# Patient Record
Sex: Female | Born: 1955 | Race: White | Hispanic: No | Marital: Married | Smoking: Never smoker
Health system: Southern US, Community
[De-identification: ages and names within clinical notes are randomized; demographics above are authoritative.]

## PROBLEM LIST (undated history)

## (undated) HISTORY — PX: REPLACEMENT TOTAL KNEE BILATERAL: SUR1225

## (undated) HISTORY — PX: THYROIDECTOMY, PARTIAL: SHX18

## (undated) HISTORY — PX: PARTIAL HYSTERECTOMY: SHX80

---

## 2020-08-23 ENCOUNTER — Ambulatory Visit: Payer: 59 | Admitting: Family

## 2020-08-23 ENCOUNTER — Encounter: Payer: Self-pay | Admitting: Family

## 2020-08-23 ENCOUNTER — Other Ambulatory Visit: Payer: Self-pay

## 2020-08-23 VITALS — BP 130/78 | HR 99 | Temp 97.9°F | Ht 64.0 in | Wt 171.0 lb

## 2020-08-23 DIAGNOSIS — Z8589 Personal history of malignant neoplasm of other organs and systems: Secondary | ICD-10-CM

## 2020-08-23 DIAGNOSIS — L719 Rosacea, unspecified: Secondary | ICD-10-CM | POA: Insufficient documentation

## 2020-08-23 DIAGNOSIS — Z96653 Presence of artificial knee joint, bilateral: Secondary | ICD-10-CM

## 2020-08-23 DIAGNOSIS — Z1211 Encounter for screening for malignant neoplasm of colon: Secondary | ICD-10-CM

## 2020-08-23 DIAGNOSIS — C4492 Squamous cell carcinoma of skin, unspecified: Secondary | ICD-10-CM

## 2020-08-23 DIAGNOSIS — E890 Postprocedural hypothyroidism: Secondary | ICD-10-CM

## 2020-08-23 DIAGNOSIS — Z1231 Encounter for screening mammogram for malignant neoplasm of breast: Secondary | ICD-10-CM

## 2020-08-23 DIAGNOSIS — Z124 Encounter for screening for malignant neoplasm of cervix: Secondary | ICD-10-CM | POA: Diagnosis not present

## 2020-08-23 DIAGNOSIS — Z85828 Personal history of other malignant neoplasm of skin: Secondary | ICD-10-CM

## 2020-08-23 DIAGNOSIS — E079 Disorder of thyroid, unspecified: Secondary | ICD-10-CM

## 2020-08-23 DIAGNOSIS — C4491 Basal cell carcinoma of skin, unspecified: Secondary | ICD-10-CM

## 2020-08-23 DIAGNOSIS — Z13220 Encounter for screening for lipoid disorders: Secondary | ICD-10-CM

## 2020-08-23 MED ORDER — AMOXICILLIN 500 MG PO CAPS: ORAL_CAPSULE | ORAL | 0 refills | Status: AC

## 2020-08-23 NOTE — Patient Instructions (Signed)
You will need go to [place] for your labs; go to basement;

## 2020-08-23 NOTE — Progress Notes (Signed)
Patricia Terry is a 64 y.o. female with the following history as recorded in [employer]:  Patient Active Problem List   Diagnosis Date Noted  . Rosacea 08/23/2020  . H/O partial thyroidectomy 08/23/2020  . Basal cell carcinoma (BCC) 08/23/2020  . Squamous cell skin cancer 08/23/2020  . History of bilateral knee replacement 08/23/2020    Current Outpatient Medications  Medication Sig Dispense Refill  . doxycycline (VIBRAMYCIN) 50 MG capsule Take 50 mg by mouth 2 (two) times daily.    Marland Kitchen amoxicillin (AMOXIL) 500 MG capsule Take 4 capsules 1 hour prior to procedure 20 capsule 0   No current facility-administered medications for this visit.    Allergies: Cat hair extract  History reviewed. No pertinent past medical history.  Past Surgical History:  Procedure Laterality Date  . PARTIAL HYSTERECTOMY    . REPLACEMENT TOTAL KNEE BILATERAL    . THYROIDECTOMY, PARTIAL      Family History  Problem Relation Age of Onset  . Pancreatic cancer Mother   . Pulmonary fibrosis Father   . Colon cancer Maternal Grandmother     Social History   Tobacco Use  . Smoking status: Never Smoker  . Smokeless tobacco: Never Used  Substance Use Topics  . Alcohol use: Yes    Comment: social    Subjective:  Presents today as a new patient; recently moved to [place] and needs to get established with specialists; no specific acute concerns today other than needs Rx for Amoxicillin due to upcoming dental procedure;  Objective:  Vitals:   08/23/20 1009  BP: 130/78  Pulse: 99  Temp: 97.9 F (36.6 C)  TempSrc: Oral  SpO2: 97%  Weight: 171 lb (77.6 kg)  Height: 5\' 4"  (1.626 m)    General: Well developed, well nourished, in no acute distress  Skin : Warm and dry.  Head: Normocephalic and atraumatic  Eyes: Sclera and conjunctiva clear; pupils round and reactive to light; extraocular movements intact  Ears: External normal; canals clear; tympanic membranes normal  Oropharynx: Pink, supple. No  suspicious lesions  Neck: Supple without thyromegaly, adenopathy  Lungs: Respirations unlabored; clear to auscultation bilaterally without wheeze, rales, rhonchi  CVS exam: normal rate and regular rhythm.  Abdomen: Soft; nontender; nondistended; normoactive bowel sounds; no masses or hepatosplenomegaly  Musculoskeletal: No deformities; no active joint inflammation  Extremities: No edema, cyanosis, clubbing  Vessels: Symmetric bilaterally  Neurologic: Alert and oriented; speech intact; face symmetrical; moves all extremities well; CNII-XII intact without focal deficit   Assessment:  1. Rosacea   2. Screening mammogram for breast cancer   3. Cervical cancer screening   4. Encounter for screening colonoscopy   5. History of basal cell cancer   6. History of squamous cell carcinoma   7. Thyroid disease   8. H/O partial thyroidectomy   9. Basal cell carcinoma (BCC), unspecified site   10. Squamous cell skin cancer   11. History of bilateral knee replacement     Plan:  1. Refer to dermatology; 2. Refer to GYN; patient prefers to have mammogram done at GYN office- agree this appropriate; 4. Refer to GI- history of colon polyps/ FH of colon cancer; 5. & 6. Refer to dermatology; 7. Will check TSH; get baseline thyroid ultrasound; 11. Rx for Amoxicillin to use as needed prior to dental visits;   This visit occurred during the SARS-CoV-2 public health emergency.  Safety protocols were in place, including screening questions prior to the visit, additional usage of staff PPE, and extensive cleaning  of exam room while observing appropriate contact time as indicated for disinfecting solutions.   Time spent 30 minutes  No follow-ups on file.  Orders Placed This Encounter  Procedures  . US THYROID    Standing Status:   Future    Standing Expiration Date:   08/23/2021    Order Specific Question:   Reason for Exam (SYMPTOM  OR DIAGNOSIS REQUIRED)    Answer:   partial thyroidectomy    Order  Specific Question:   Preferred imaging location?    Answer:   [facility]  . Ambulatory referral to Gynecology    Referral Priority:   Routine    Referral Type:   Consultation    Referral Reason:   Specialty Services Required    Requested Specialty:   Gynecology    Number of Visits Requested:   1  . Ambulatory referral to Gastroenterology    Referral Priority:   Routine    Referral Type:   Consultation    Referral Reason:   Specialty Services Required    Number of Visits Requested:   1  . Ambulatory referral to Dermatology    Referral Priority:   Routine    Referral Type:   Consultation    Referral Reason:   Specialty Services Required    Requested Specialty:   Dermatology    Number of Visits Requested:   1    Requested Prescriptions   Signed Prescriptions Disp Refills  . amoxicillin (AMOXIL) 500 MG capsule 20 capsule 0    Sig: Take 4 capsules 1 hour prior to procedure

## 2020-09-13 ENCOUNTER — Ambulatory Visit
Admission: RE | Admit: 2020-09-13 | Discharge: 2020-09-13 | Disposition: A | Discharge status: Home / Self Care | Payer: 59 | Source: Ambulatory Visit | Attending: Family | Admitting: Family

## 2020-09-13 DIAGNOSIS — E079 Disorder of thyroid, unspecified: Secondary | ICD-10-CM

## 2020-09-14 ENCOUNTER — Other Ambulatory Visit: Payer: Self-pay | Admitting: Family

## 2020-09-14 DIAGNOSIS — E041 Nontoxic single thyroid nodule: Secondary | ICD-10-CM

## 2020-09-23 ENCOUNTER — Ambulatory Visit
Admission: RE | Admit: 2020-09-23 | Discharge: 2020-09-23 | Disposition: A | Discharge status: Home / Self Care | Payer: 59 | Source: Ambulatory Visit | Attending: Family | Admitting: Family

## 2020-09-23 ENCOUNTER — Other Ambulatory Visit (HOSPITAL_COMMUNITY)
Admission: RE | Admit: 2020-09-23 | Discharge: 2020-09-23 | Disposition: A | Discharge status: Home / Self Care | Payer: 59 | Source: Ambulatory Visit | Attending: Family | Admitting: Family

## 2020-09-23 DIAGNOSIS — E041 Nontoxic single thyroid nodule: Secondary | ICD-10-CM

## 2020-09-23 DIAGNOSIS — D34 Benign neoplasm of thyroid gland: Secondary | ICD-10-CM | POA: Diagnosis not present

## 2020-09-26 LAB — CYTOLOGY - NON PAP

## 2020-09-30 ENCOUNTER — Other Ambulatory Visit: Payer: Self-pay | Admitting: Family

## 2020-09-30 DIAGNOSIS — E049 Nontoxic goiter, unspecified: Secondary | ICD-10-CM

## 2020-10-18 ENCOUNTER — Other Ambulatory Visit: Payer: Self-pay | Admitting: Obstetrics and Gynecology

## 2020-10-18 DIAGNOSIS — R928 Other abnormal and inconclusive findings on diagnostic imaging of breast: Secondary | ICD-10-CM

## 2020-10-20 ENCOUNTER — Other Ambulatory Visit: Payer: Self-pay | Admitting: Surgery

## 2020-10-20 DIAGNOSIS — R1310 Dysphagia, unspecified: Secondary | ICD-10-CM

## 2020-10-25 ENCOUNTER — Other Ambulatory Visit: Payer: Self-pay | Admitting: Surgery

## 2020-10-25 ENCOUNTER — Ambulatory Visit
Admission: RE | Admit: 2020-10-25 | Discharge: 2020-10-25 | Disposition: A | Discharge status: Home / Self Care | Payer: 59 | Source: Ambulatory Visit | Attending: Surgery | Admitting: Surgery

## 2020-10-25 DIAGNOSIS — R1310 Dysphagia, unspecified: Secondary | ICD-10-CM

## 2020-10-31 ENCOUNTER — Other Ambulatory Visit: Payer: 59

## 2020-11-02 ENCOUNTER — Ambulatory Visit
Admission: RE | Admit: 2020-11-02 | Discharge: 2020-11-02 | Disposition: A | Discharge status: Home / Self Care | Payer: 59 | Source: Ambulatory Visit | Attending: Obstetrics and Gynecology | Admitting: Obstetrics and Gynecology

## 2020-11-02 ENCOUNTER — Ambulatory Visit: Payer: 59

## 2020-11-02 ENCOUNTER — Other Ambulatory Visit: Payer: Self-pay

## 2020-11-02 DIAGNOSIS — R928 Other abnormal and inconclusive findings on diagnostic imaging of breast: Secondary | ICD-10-CM

## 2021-09-11 ENCOUNTER — Other Ambulatory Visit: Payer: Self-pay | Admitting: Surgery

## 2021-09-11 DIAGNOSIS — E041 Nontoxic single thyroid nodule: Secondary | ICD-10-CM

## 2021-10-02 ENCOUNTER — Ambulatory Visit
Admission: RE | Admit: 2021-10-02 | Discharge: 2021-10-02 | Disposition: A | Discharge status: Home / Self Care | Payer: Medicare Other | Source: Ambulatory Visit | Attending: Surgery | Admitting: Surgery

## 2021-10-02 DIAGNOSIS — E041 Nontoxic single thyroid nodule: Secondary | ICD-10-CM

## 2022-07-20 ENCOUNTER — Telehealth: Payer: Self-pay

## 2022-07-20 NOTE — Telephone Encounter (Signed)
Nurse Assessment Nurse: Toy Cookey, RN, [facility] Date/Time Eilene Ghazi Time): 07/20/2022 8:10:06 AM Confirm and document reason for call. If symptomatic, describe symptoms. ---Caller states she tested positive for COVID this morning. Only Sx is congestion. Started on Sunday. Up-to-date Vaccinations. Does the patient have any new or worsening symptoms? ---Yes Will a triage be completed? ---Yes Related visit to physician within the last 2 weeks? ---No Does the PT have any chronic conditions? (i.e. diabetes, asthma, this includes High risk factors for pregnancy, etc.) ---No Is this a behavioral health or substance abuse call? ---No Guidelines Guideline Title Affirmed Question Affirmed Notes Nurse Date/Time (Moody Time) COVID-19 - Diagnosed or Suspected [1] HIGH RISK patient (e.g., weak immune system, age > 70 years, obesity with BMI 30 or higher, pregnant, chronic lung disease or other chronic medical condition) AND [2] COVID symptoms (e.g., cough, fever) (Exceptions: Already seen by PCP and no Toy Cookey, RN, [facility] 07/20/2022 8:16:56 AM PLEASE NOTE: All timestamps contained within this report are represented as [place] Standard Time. CONFIDENTIALTY NOTICE: This fax transmission is intended only for the addressee. It contains information that is legally privileged, confidential or otherwise protected from use or disclosure. If you are not the intended recipient, you are strictly prohibited from reviewing, disclosing, copying using or disseminating any of this information or taking any action in reliance on or regarding this information. If you have received this fax in error, please notify us immediately by telephone so that we can arrange for its return to [place]. Phone: [phone], Toll-Free: [phone], Fax: [phone] Page: 2 of 2 Call Id: [id number] Guidelines Guideline Title Affirmed Question Affirmed Notes Nurse Date/Time Eilene Ghazi Time) new or worsening symptoms.) Disp. Time  Eilene Ghazi Time) Disposition Final User 07/20/2022 8:21:21 AM Call PCP within 24 Hours Yes Toy Cookey RN, [facility] Final Disposition 07/20/2022 8:21:21 AM Call PCP within 24 Hours Yes Toy Cookey, RN, [facility] Disagree/Comply Comply Caller Understands Yes PreDisposition InappropriateToAsk Care Advice Given Per Guideline CALL PCP WITHIN 24 HOURS: * IF OFFICE WILL BE OPEN: Call the office when it opens tomorrow morning. CALL BACK IF: * You become worse Comments User: Dulcy Fanny, RN Date/Time Eilene Ghazi Time): 07/20/2022 8:22:55 AM requesting call back from office

## 2022-07-23 NOTE — Telephone Encounter (Signed)
I have called the pt to check up on them. They had Covid for a week and now they are on the mend. They are feeling better and they both thing that the Vaccines that they got on 06/20/22 has helped lessen the side effects of the medication. I stated understanding and wished them a happy holiday weekend.

## 2023-01-01 IMAGING — MG MM DIGITAL DIAGNOSTIC UNILAT*R* W/ TOMO W/ CAD
4 series · 4 of 12 positions shown · non-contrast
Comparison: Previous exam(s).

CLINICAL DATA: Patient returns after screening study for evaluation
of possible RIGHT breast asymmetry.

EXAM:
DIGITAL DIAGNOSTIC UNILATERAL RIGHT MAMMOGRAM WITH TOMOSYNTHESIS AND
CAD
TECHNIQUE: Right digital diagnostic mammography and breast tomosynthesis was
performed. The images were evaluated with computer-aided detection.

[R MLO synth-2D]
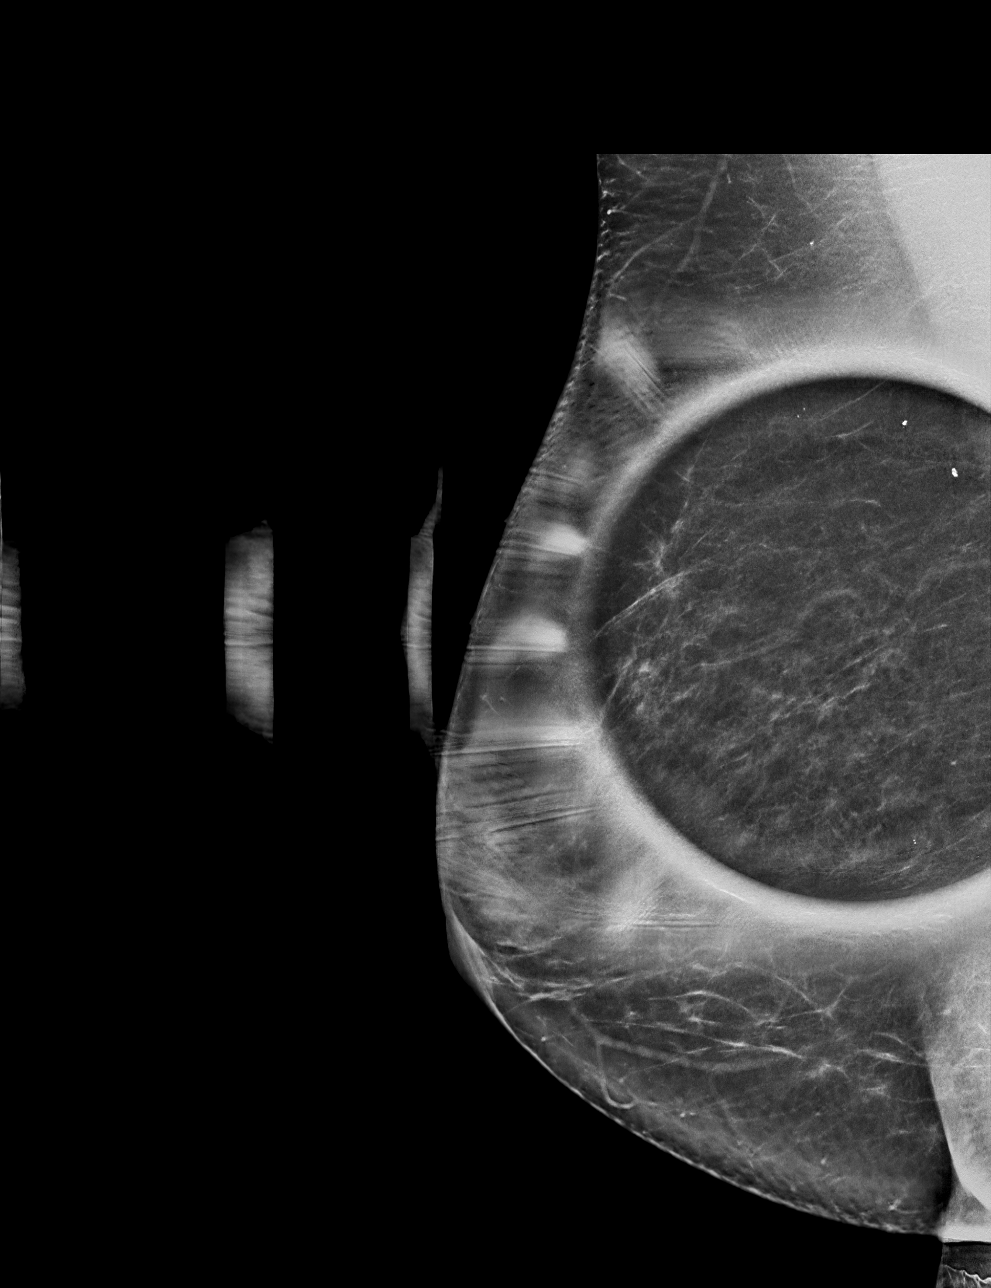

[R XCCL synth-2D]
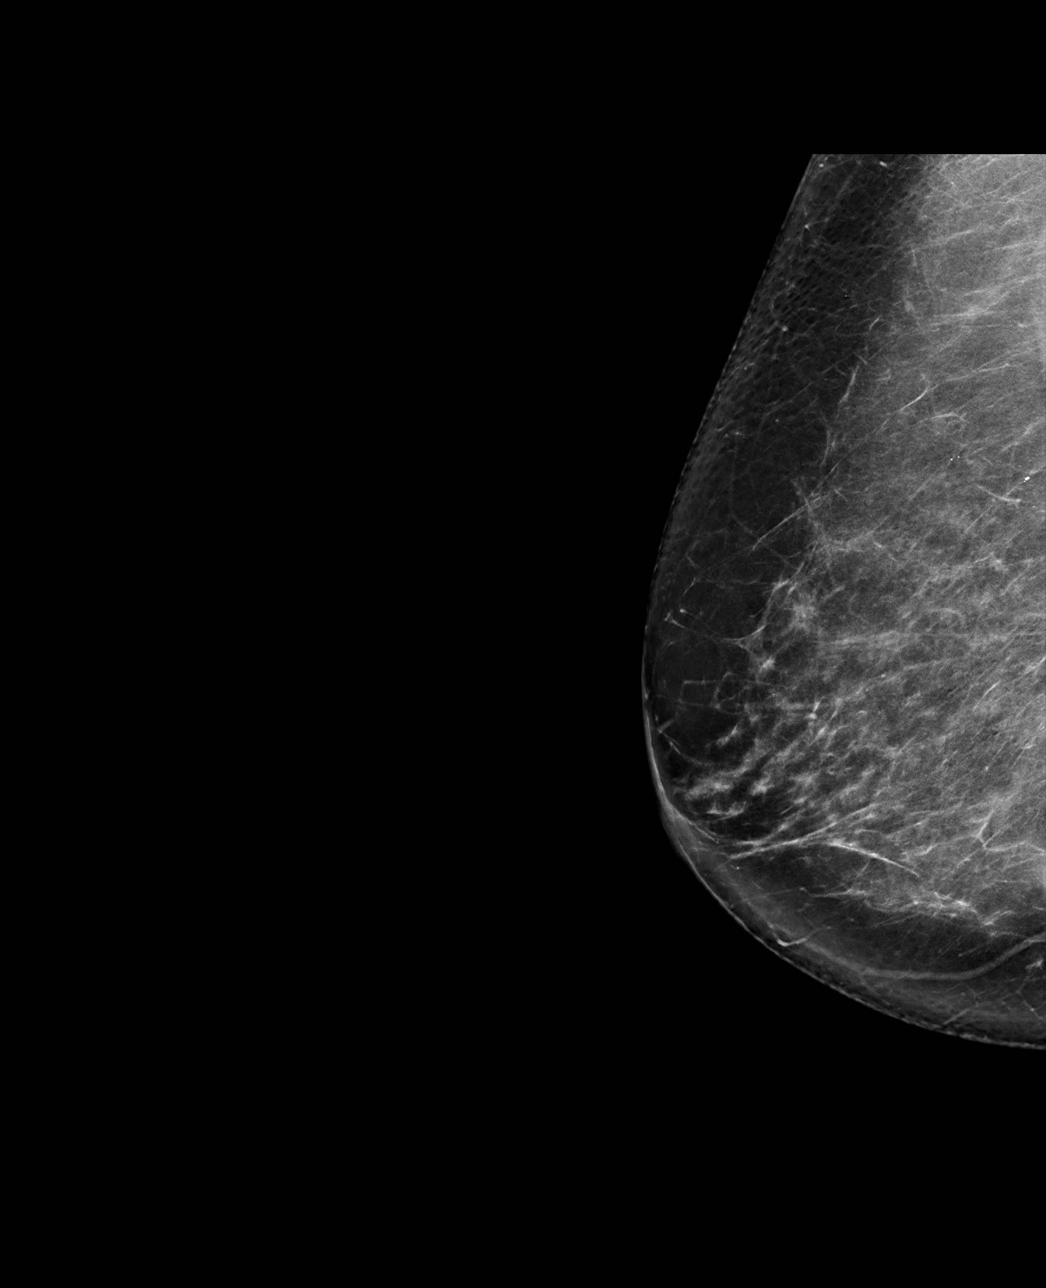

[R MLO tomo · tomo slice 41/81.0]
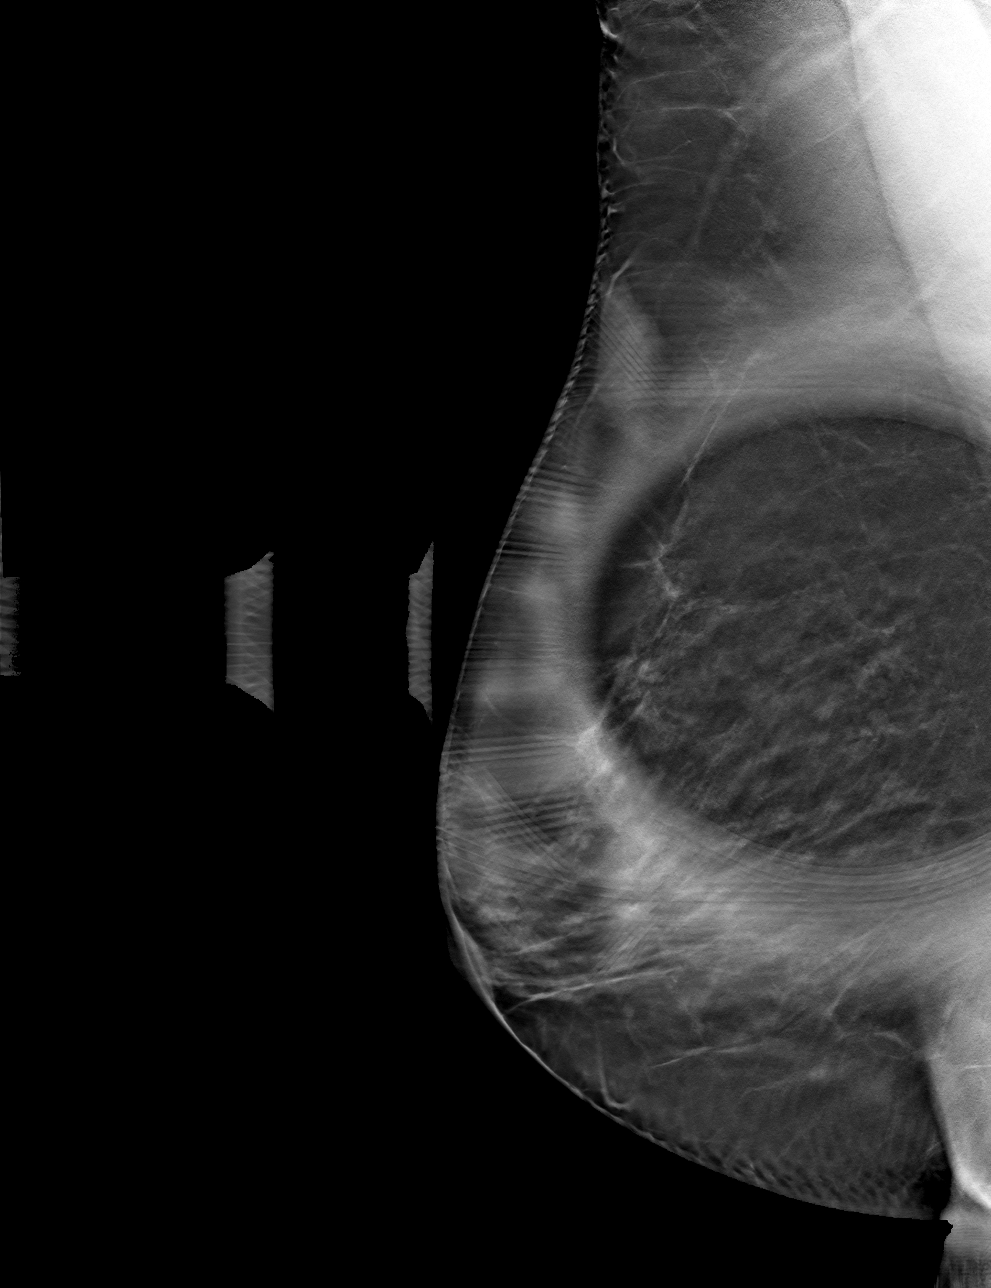

[R XCCL tomo · tomo slice 43/85.0]
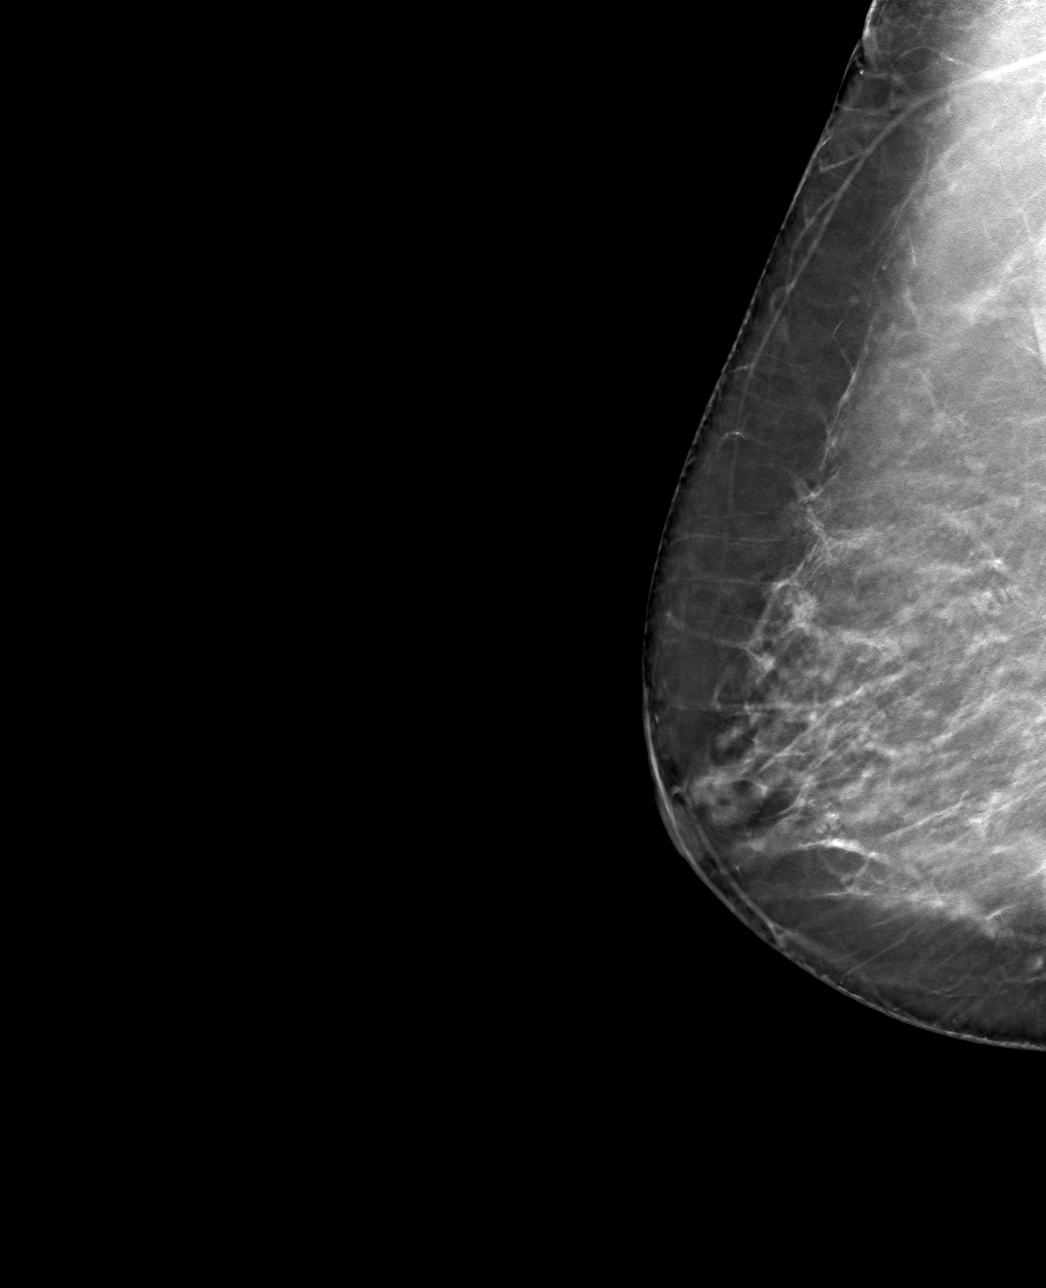

[4 of 12 positions shown; findings below may reference images not displayed]

ACR Breast Density Category b: There are scattered areas of
fibroglandular density.
FINDINGS: Additional 2-D and 3-D images are performed. These views show no
persistent asymmetry in the UPPER portion of the RIGHT breast. No
suspicious mass, distortion, or microcalcifications are identified
to suggest presence of malignancy.
IMPRESSION: No mammographic evidence for malignancy.

RECOMMENDATION:
Screening mammogram in one year.(Code:QQ-[id number])

I have discussed the findings and recommendations with the patient.
If applicable, a reminder letter will be sent to the patient
regarding the next appointment.

BI-RADS CATEGORY  1: Negative.

## 2023-02-12 ENCOUNTER — Other Ambulatory Visit: Payer: Self-pay

## 2023-02-12 DIAGNOSIS — E042 Nontoxic multinodular goiter: Secondary | ICD-10-CM

## 2023-02-14 ENCOUNTER — Ambulatory Visit
Admission: RE | Admit: 2023-02-14 | Discharge: 2023-02-14 | Disposition: A | Discharge status: Home / Self Care | Payer: Medicare Other | Source: Ambulatory Visit

## 2023-02-14 DIAGNOSIS — E042 Nontoxic multinodular goiter: Secondary | ICD-10-CM

## 2023-12-01 IMAGING — US US THYROID
1 series · 13 of 25 positions shown · non-contrast
Comparison: Prior thyroid ultrasound 09/13/2020

CLINICAL DATA: Goiter. History of right hemithyroidectomy. Patient
previously underwent biopsy of left mid thyroid nodule on 09/23/2020

EXAM:
THYROID ULTRASOUND
TECHNIQUE: Ultrasound examination of the thyroid gland and adjacent soft
tissues was performed.

[Series 1: us thyroid · 0.07mm/px · 13 of 44 slices shown]
[im 1/44]
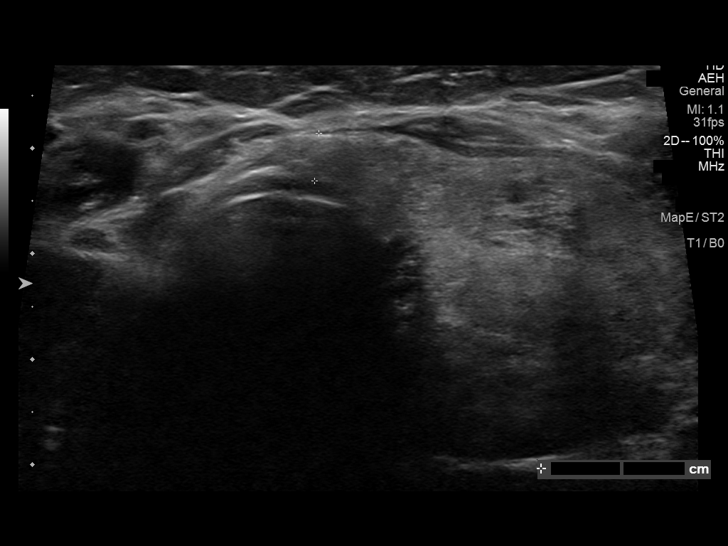
[im 4/44]
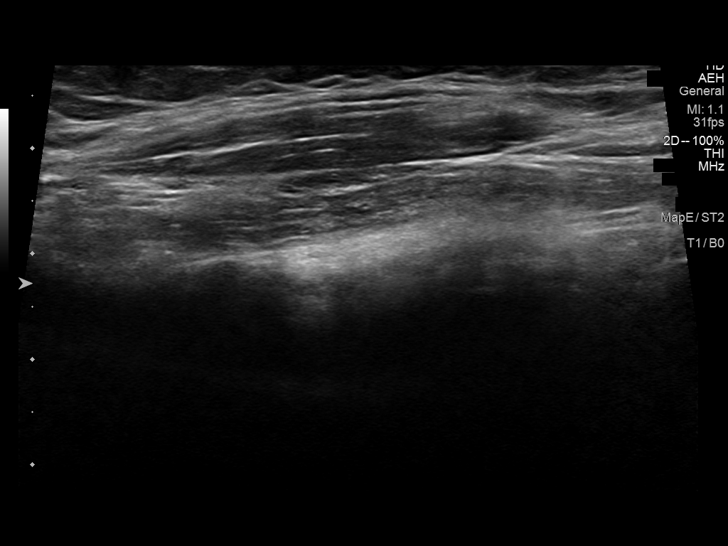
[im 8/44]
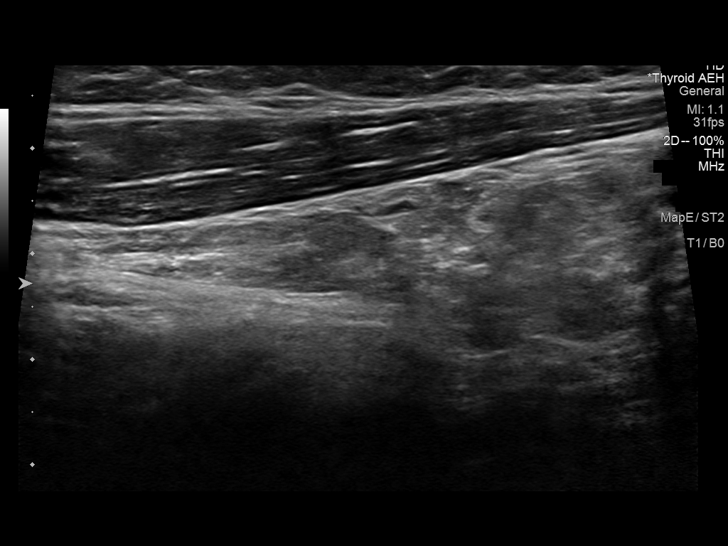
[im 11/44]
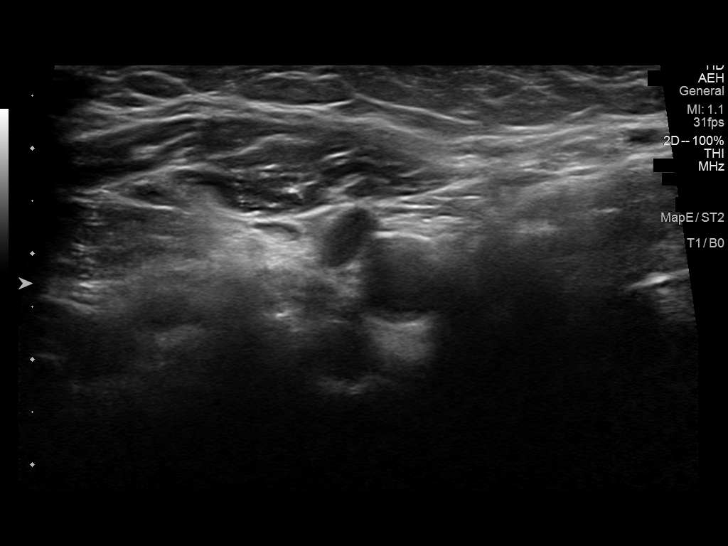
[im 15/44]
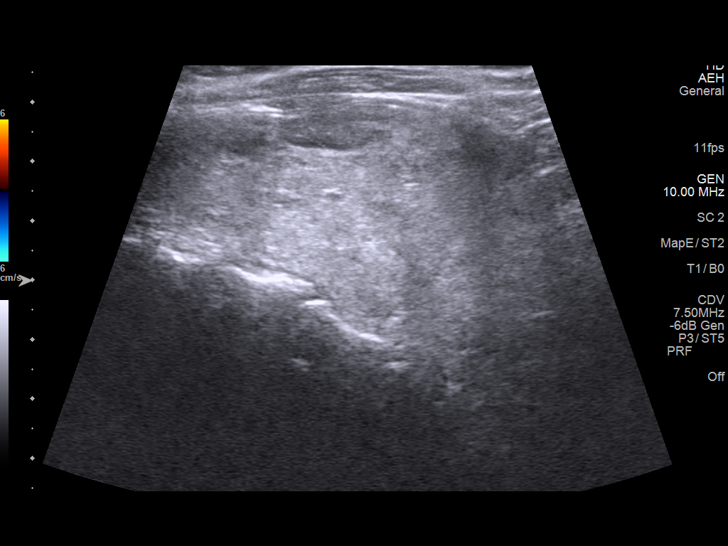
[im 18/44]
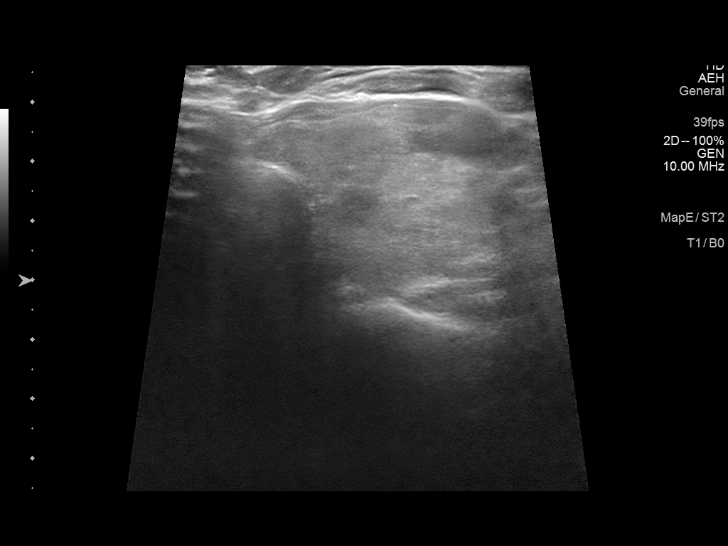
[im 22/44]
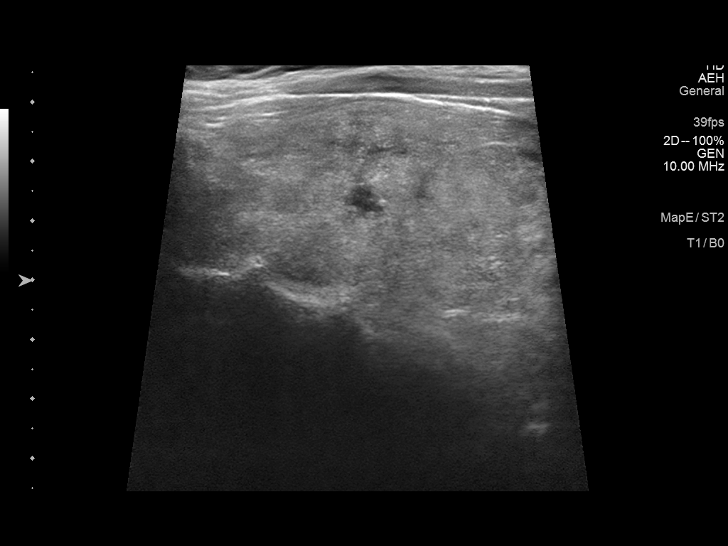
[im 26/44]
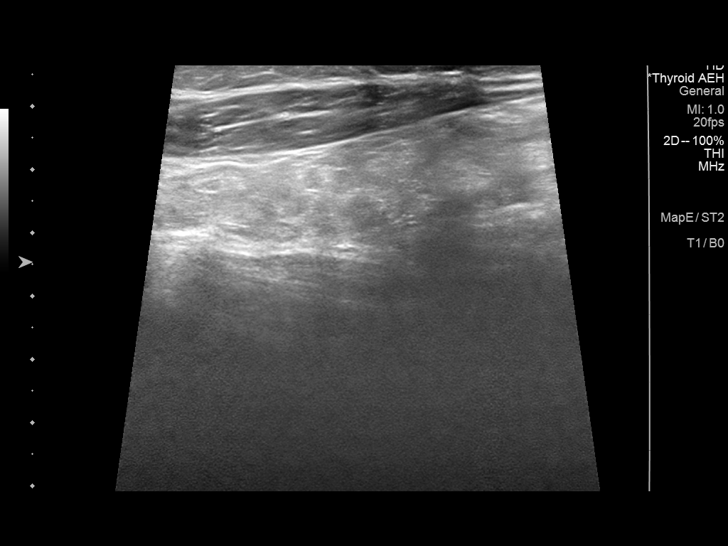
[im 29/44]
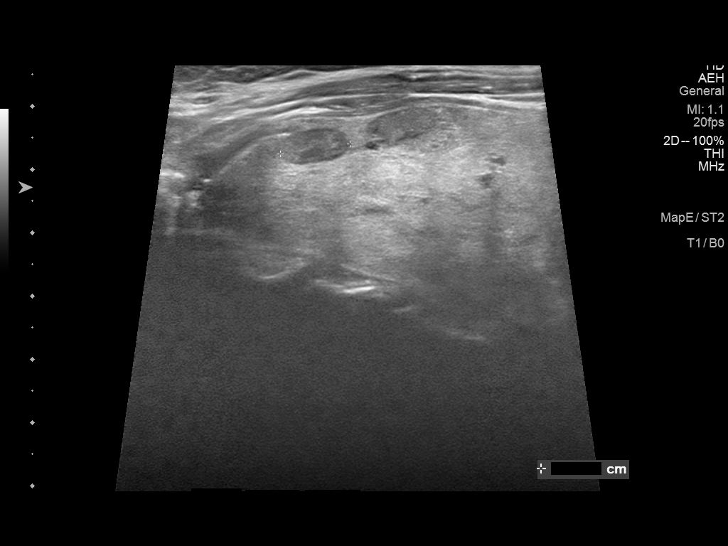
[im 33/44]
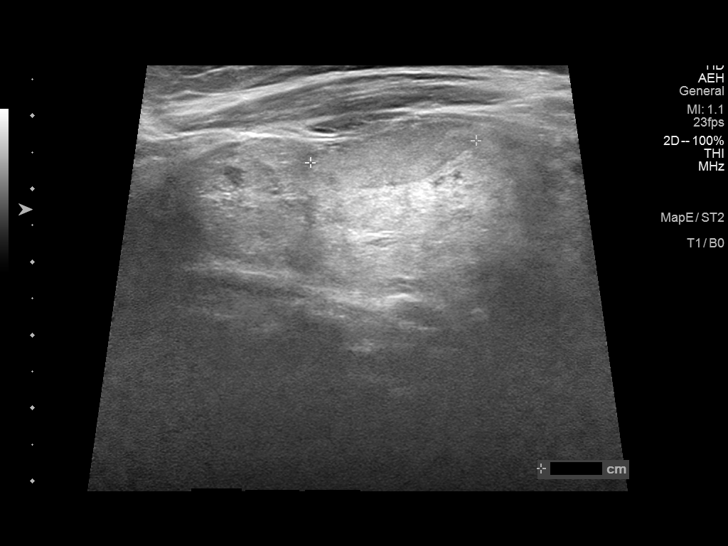
[im 36/44]
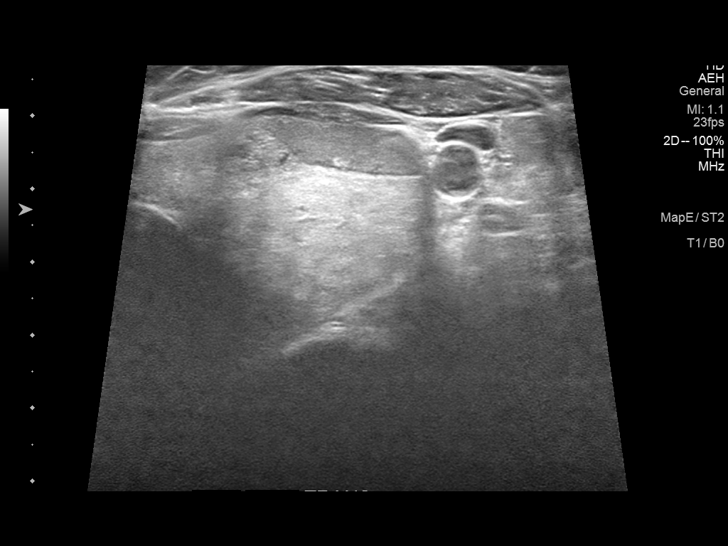
[im 40/44]
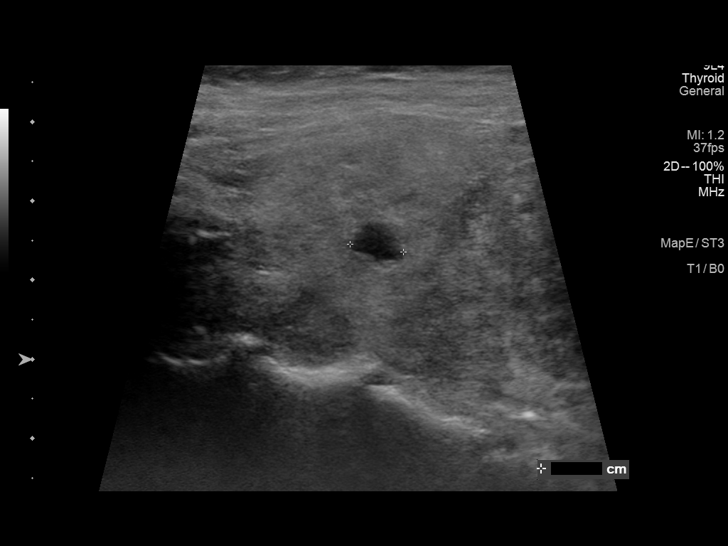
[im 44/44]
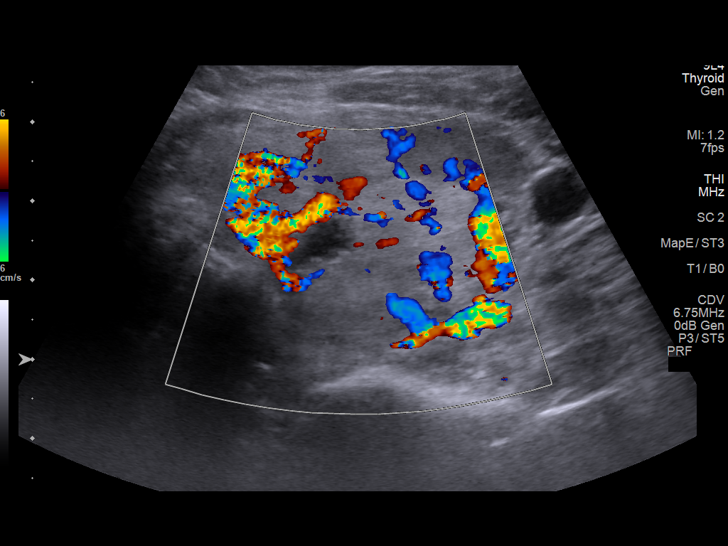

[13 of 25 positions shown; findings below may reference images not displayed]

FINDINGS: Parenchymal Echotexture: Markedly heterogenous

Isthmus: 0.5 cm

Right lobe: Surgically absent

Left lobe: 7.7 x 3.8 x 3.7 cm

_________________________________________________________

Estimated total number of nodules >/= 1 cm: 2

Number of spongiform nodules >/=  2 cm not described below (TR1): 0

Number of mixed cystic and solid nodules >/= 1.5 cm not described
below (TR2): 0

_________________________________________________________

Nodule # 1:

Prior biopsy: No

Location: Right; Superior

Maximum size: 1.2 cm; Other 2 dimensions: 1.1 x 0.6 cm, previously,
1.2 x 1.1 x 0.5 cm

Composition: solid/almost completely solid (2)

Echogenicity: hypoechoic (2)

Shape: not taller-than-wide (0)

Margins: smooth (0)

Echogenic foci: none (0)

ACR TI-RADS total points: 4.

ACR TI-RADS risk category:  TR4 (4-6 points).

Significant change in size (>/= 20% in two dimensions and minimal
increase of 2 mm): No

Change in features: No

Change in ACR TI-RADS risk category: No

ACR TI-RADS recommendations:

*Given size (>/= 1 - 1.4 cm) and appearance, a follow-up ultrasound
in 1 year should be considered based on TI-RADS criteria.

_________________________________________________________

Nodule # 2: The previously biopsied nodule in the left mid gland
measures 2.3 x 2.3 x 0.6 cm, essentially unchanged compared to 2.6 x
1.9 x 0.8 cm previously.
IMPRESSION: 1. No significant interval change in the size or appearance of the
previously biopsied nodule in the left mid gland. Assuming a
previously benign biopsy results, no further follow-up required.
2. Confirmed 1 year stability of 1.2 cm TI-RADS category 4 nodule in
the left superior gland. This lesion continues to meet criteria for
imaging surveillance. Recommend follow-up ultrasound in 1 year.
3. Surgical changes of prior right hemithyroidectomy.

The above is in keeping with the ACR TI-RADS recommendations - [HOSPITAL] 7063;[DATE].

## 2024-02-12 ENCOUNTER — Other Ambulatory Visit: Payer: Self-pay

## 2024-02-12 DIAGNOSIS — E042 Nontoxic multinodular goiter: Secondary | ICD-10-CM

## 2024-07-16 ENCOUNTER — Other Ambulatory Visit: Payer: Self-pay

## 2024-07-16 DIAGNOSIS — E042 Nontoxic multinodular goiter: Secondary | ICD-10-CM

## 2024-08-05 ENCOUNTER — Ambulatory Visit: Admission: RE | Admit: 2024-08-05 | Discharge: 2024-08-05 | Disposition: A | Source: Ambulatory Visit

## 2024-08-05 DIAGNOSIS — E042 Nontoxic multinodular goiter: Secondary | ICD-10-CM
